# Patient Record
Sex: Male | Born: 1980 | Race: White | Hispanic: No | Marital: Married | Smoking: Never smoker
Health system: Southern US, Community
[De-identification: ages and names within clinical notes are randomized; demographics above are authoritative.]

## PROBLEM LIST (undated history)

## (undated) DIAGNOSIS — E7800 Pure hypercholesterolemia, unspecified: Secondary | ICD-10-CM

---

## 2004-12-06 ENCOUNTER — Emergency Department: Payer: Self-pay | Admitting: Emergency Medicine

## 2005-04-06 ENCOUNTER — Ambulatory Visit: Payer: Self-pay | Admitting: Internal Medicine

## 2007-03-17 ENCOUNTER — Emergency Department: Payer: Self-pay | Admitting: Emergency Medicine

## 2007-12-20 ENCOUNTER — Emergency Department: Payer: Self-pay | Admitting: Emergency Medicine

## 2008-10-09 ENCOUNTER — Emergency Department: Payer: Self-pay | Admitting: Emergency Medicine

## 2010-01-29 ENCOUNTER — Emergency Department: Payer: Self-pay | Admitting: Unknown Physician Specialty

## 2010-02-16 ENCOUNTER — Emergency Department: Payer: Self-pay | Admitting: Emergency Medicine

## 2010-11-26 IMAGING — CR DG CHEST 2V
1 series · 2 of 2 positions shown · non-contrast
Comparison: none

REASON FOR EXAM: vomited
COMMENTS:

[Series 1: view not recorded · 0.17mm/px · 2 of 2 slices shown]
[im 1/2]
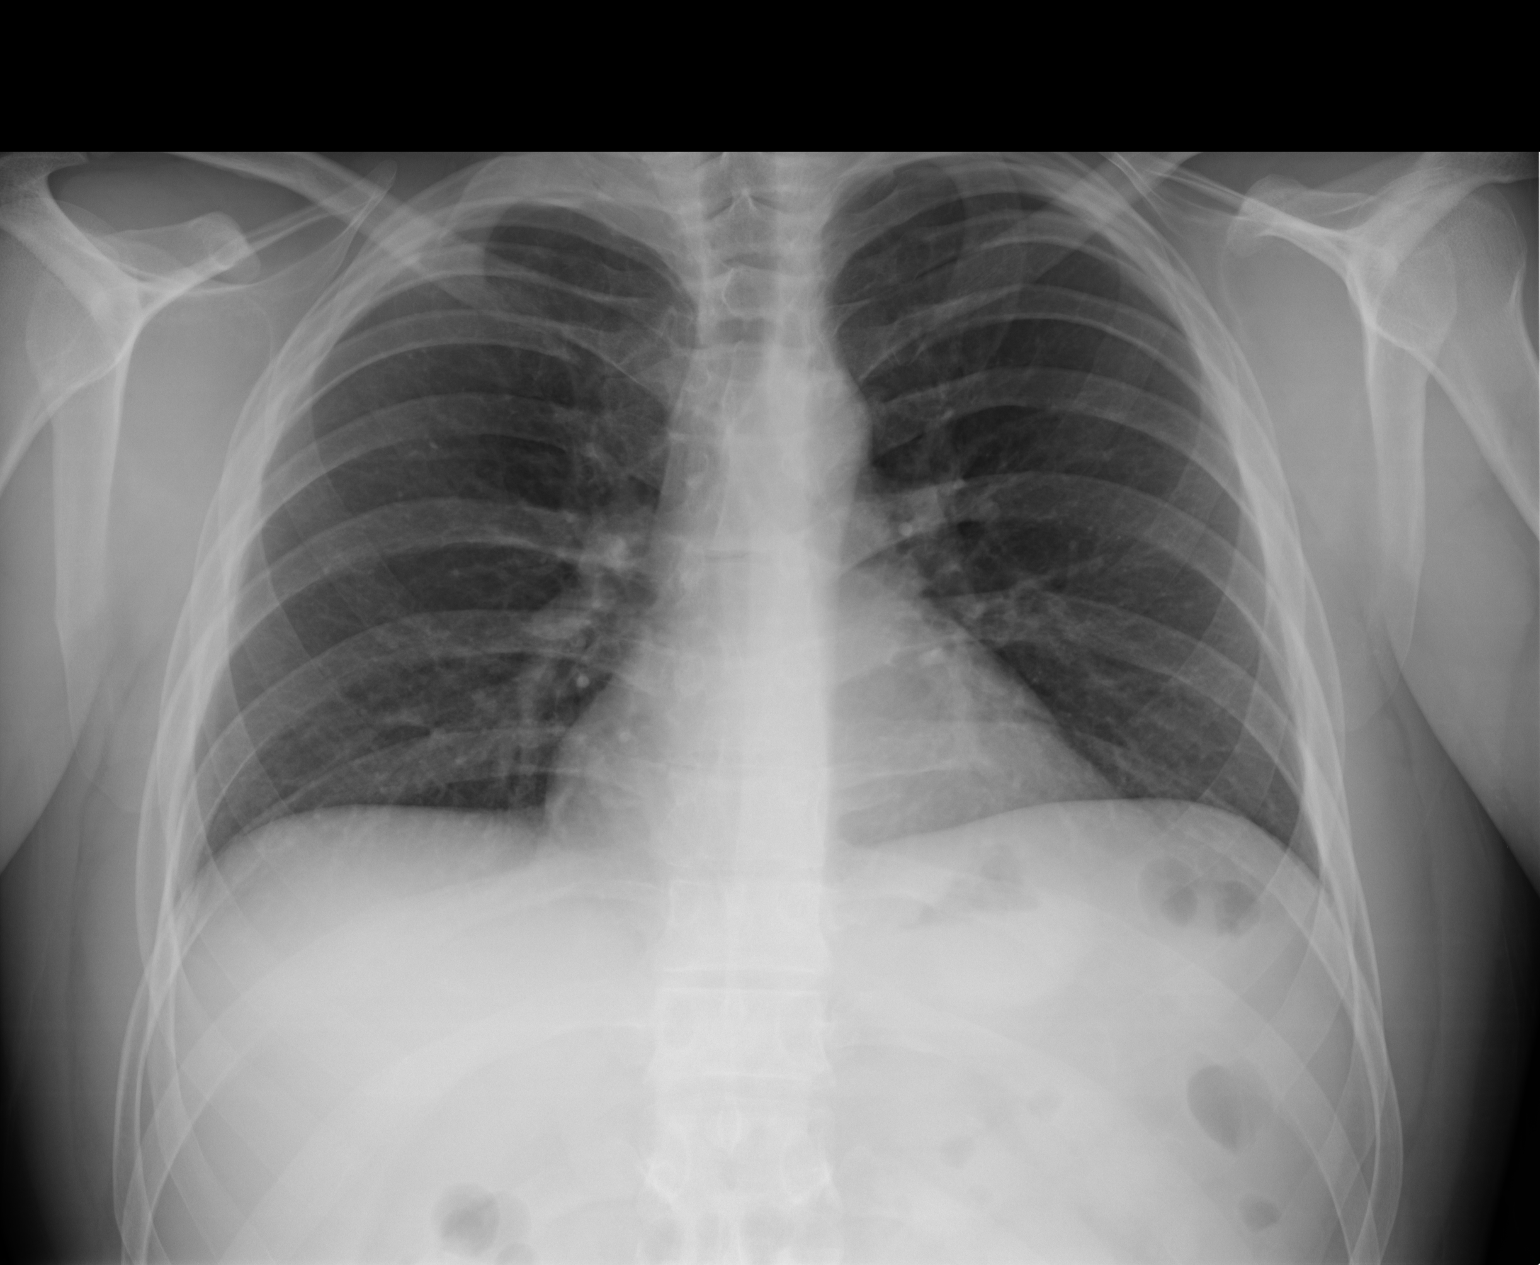
[im 2/2]
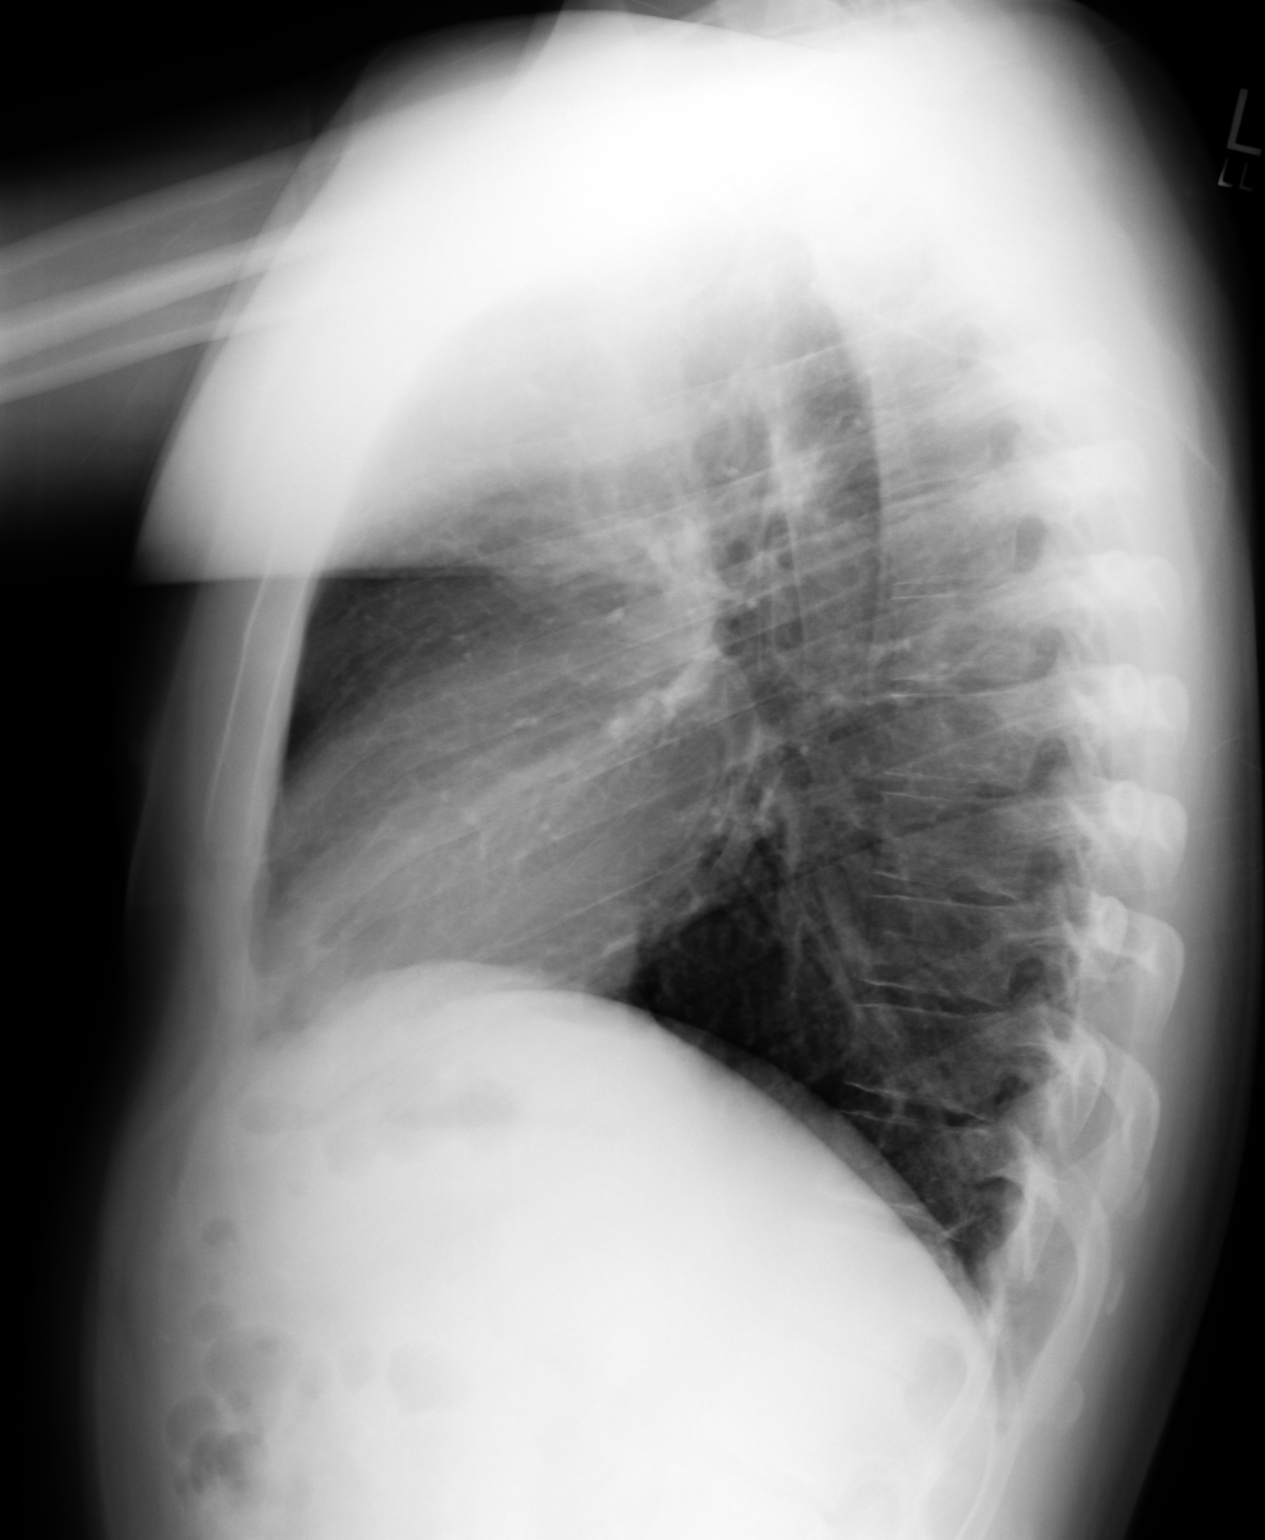

[2 of 2 positions shown; findings below may reference images not displayed]

PROCEDURE:     DXR - DXR CHEST PA (OR AP) AND LATERAL  - February 16, 2010  [DATE]

RESULT:     The lungs are mildly hypoinflated. I see no focal infiltrate.
There is no evidence of a pleural effusion. The cardiac silhouette is normal
in size. The pulmonary vascularity is not engorged. The gas pattern in the
upper abdomen appears normal.
IMPRESSION: There is bilateral pulmonary hypoinflation. Otherwise the
examination is within the limits of normal.

## 2011-08-10 ENCOUNTER — Emergency Department: Payer: Self-pay | Admitting: Emergency Medicine

## 2021-03-31 ENCOUNTER — Ambulatory Visit
Admission: EM | Admit: 2021-03-31 | Discharge: 2021-03-31 | Disposition: A | Discharge status: Home / Self Care | Payer: BC Managed Care – PPO | Attending: Family Medicine | Admitting: Family Medicine

## 2021-03-31 DIAGNOSIS — M79661 Pain in right lower leg: Secondary | ICD-10-CM

## 2021-03-31 DIAGNOSIS — S80861A Insect bite (nonvenomous), right lower leg, initial encounter: Secondary | ICD-10-CM | POA: Diagnosis not present

## 2021-03-31 DIAGNOSIS — M7989 Other specified soft tissue disorders: Secondary | ICD-10-CM | POA: Diagnosis not present

## 2021-03-31 DIAGNOSIS — W57XXXA Bitten or stung by nonvenomous insect and other nonvenomous arthropods, initial encounter: Secondary | ICD-10-CM | POA: Diagnosis not present

## 2021-03-31 MED ORDER — LEVOCETIRIZINE DIHYDROCHLORIDE 5 MG PO TABS: 5.0000 mg | ORAL_TABLET | Freq: Every evening | ORAL | 0 refills | Status: AC

## 2021-03-31 MED ORDER — PREDNISONE 20 MG PO TABS: 20.0000 mg | ORAL_TABLET | Freq: Every day | ORAL | 0 refills | Status: DC

## 2021-03-31 MED ORDER — PREDNISONE 20 MG PO TABS: 20.0000 mg | ORAL_TABLET | Freq: Every day | ORAL | 0 refills | Status: AC

## 2021-03-31 MED ORDER — DEXAMETHASONE 1 MG/ML PO CONC
10.0000 mg | Freq: Once | ORAL | Status: AC
  Administered 2021-03-31: 10 mg via ORAL

## 2021-03-31 MED ORDER — TRIAMCINOLONE ACETONIDE 0.025 % EX OINT: 1.0000 "application " | TOPICAL_OINTMENT | Freq: Two times a day (BID) | CUTANEOUS | 0 refills | Status: AC

## 2021-03-31 NOTE — ED Provider Notes (Signed)
[facility]    CSN: [id number] Arrival date & time: 03/31/21  0836      History   Chief Complaint Chief Complaint  Patient presents with   Insect Bite    HPI Brian Harrell is a 40 y.o. male.   HPI Patient presents today with swelling and redness along with itching and irritation involving the right lower leg.  Patient reports he was stung by multiple yellow jackets yesterday evening and upon awakening this morning the swelling and redness has worsened.  He denies any difficulty swallowing or any swelling of the lips.  Denies any previous reactions to bee or hornet stings.  No past medical history on file.  There are no problems to display for this patient.     Home Medications    Prior to Admission medications   Medication Sig Start Date End Date Taking? Authorizing Provider  levocetirizine (XYZAL) 5 MG tablet Take 1 tablet (5 mg total) by mouth every evening. 03/31/21  Yes Bing Neighbors, FNP  triamcinolone (KENALOG) 0.025 % ointment Apply 1 application topically 2 (two) times daily. 03/31/21  Yes Bing Neighbors, FNP  predniSONE (DELTASONE) 20 MG tablet Take 1 tablet (20 mg total) by mouth daily with breakfast for 5 days. 04/01/21 04/06/21  Bing Neighbors, FNP    Family History No family history on file.  Social History     Allergies   Patient has no known allergies.   Review of Systems Review of Systems Pertinent negatives listed in HPI   Physical Exam Triage Vital Signs ED Triage Vitals  Enc Vitals Group     BP 03/31/21 0839 132/87     Pulse Rate 03/31/21 0839 73     Resp 03/31/21 0839 20     Temp 03/31/21 0839 98 F (36.7 C)     Temp src --      SpO2 03/31/21 0839 98 %     Weight --      Height --      Head Circumference --      Peak Flow --      Pain Score 03/31/21 0837 4     Pain Loc --      Pain Edu? --      Excl. in GC? --    No data found.  Updated Vital Signs BP 132/87   Pulse 73   Temp 98 F (36.7 C)   Resp  20   SpO2 98%   Visual Acuity Right Eye Distance:   Left Eye Distance:   Bilateral Distance:    Right Eye Near:   Left Eye Near:    Bilateral Near:     Physical Exam General appearance: Alert, well developed, well nourished, cooperative and in no distress Head: Normocephalic, without obvious abnormality, atraumatic Respiratory: Respirations even and unlabored, normal respiratory rate Heart: Rate and rhythm normal.  Extremities: Right lower leg  diffuse swelling , visible papules consistent with that of insect bites, erythema Skin: Skin color, texture, turgor normal. No rashes seen  Psych: Appropriate mood and affect. Neurologic: GCS 15, normal coordination normal gait  UC Treatments / Results  Labs (all labs ordered are listed, but only abnormal results are displayed) Labs Reviewed - No data to display  EKG   Radiology No results found.  Procedures Procedures (including critical care time)  Medications Ordered in UC Medications  dexamethasone (DECADRON) 1 MG/ML solution 10 mg (has no administration in time range)    Initial Impression / Assessment  and Plan / UC Course  I have reviewed the triage vital signs and the nursing notes.  Pertinent labs & imaging results that were available during my care of the patient were reviewed by me and considered in my medical decision making (see chart for details).    Local reaction related to insect bite treating today with oral Decadron patient will take a 5-day course of prednisone 20 mg once daily starting tomorrow.  Also prescribed triamcinolone cream 3 times daily as needed for management of pruritus.  Work note provided.  Return precautions given if symptoms worsen or do not improve. Final Clinical Impressions(s) / UC Diagnoses   Final diagnoses:  Insect bite of right lower leg with local reaction, initial encounter  Pain and swelling of right lower leg     Discharge Instructions      Start prednisone tomorrow.    Start levocetirizine 5 mg tonight prior to bedtime and take daily for the next 7 days. Apply triamcinolone cream 3 times daily as needed to lower leg until itching and swelling resolves.     ED Prescriptions     Medication Sig Dispense Auth. Provider   levocetirizine (XYZAL) 5 MG tablet Take 1 tablet (5 mg total) by mouth every evening. 10 tablet Bing Neighbors, FNP   triamcinolone (KENALOG) 0.025 % ointment Apply 1 application topically 2 (two) times daily. 80 g Bing Neighbors, FNP   predniSONE (DELTASONE) 20 MG tablet  (Status: Discontinued) Take 1 tablet (20 mg total) by mouth daily with breakfast for 5 days. 5 tablet Bing Neighbors, FNP   predniSONE (DELTASONE) 20 MG tablet Take 1 tablet (20 mg total) by mouth daily with breakfast for 5 days. 5 tablet Bing Neighbors, FNP      PDMP not reviewed this encounter.   Bing Neighbors, [place] 03/31/21 [phone]

## 2021-03-31 NOTE — ED Triage Notes (Signed)
Pt presents with c/o bee sting to right lower leg that happened last night, foot is swollen and painful

## 2021-03-31 NOTE — Discharge Instructions (Addendum)
Start prednisone tomorrow.   Start levocetirizine 5 mg tonight prior to bedtime and take daily for the next 7 days. Apply triamcinolone cream 3 times daily as needed to lower leg until itching and swelling resolves.

## 2022-07-09 ENCOUNTER — Ambulatory Visit
Admission: EM | Admit: 2022-07-09 | Discharge: 2022-07-09 | Disposition: A | Discharge status: Home / Self Care | Payer: BC Managed Care – PPO

## 2022-07-09 ENCOUNTER — Other Ambulatory Visit: Payer: Self-pay

## 2022-07-09 ENCOUNTER — Encounter: Payer: Self-pay | Admitting: Emergency Medicine

## 2022-07-09 DIAGNOSIS — S0990XA Unspecified injury of head, initial encounter: Secondary | ICD-10-CM | POA: Diagnosis not present

## 2022-07-09 NOTE — ED Triage Notes (Addendum)
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 Pt reports has discussed event with employer and they are aware of incident from yesterday and pt reports is not workman's comp. Reports just need to be evaluated before can return to work.   Pt denies loc or being on blood thinners.

## 2022-07-09 NOTE — Discharge Instructions (Addendum)
Get plenty of rest. May take Tylenol as needed for pain.  May apply ice to the head to help with swelling. Apply for 20 minutes, remove for 1 hour, then repeat. Go to the emergency department immediately if you develop worsening headache, change in mental status, visual changes, dizziness, difficulty speaking or inability to speak, or difficult to arouse. Follow-up as needed.

## 2022-07-09 NOTE — ED Provider Notes (Signed)
[facility]    CSN: [id number] Arrival date & time: 07/09/22  0817      History   Chief Complaint Chief Complaint  Patient presents with   Head Injury    HPI Brian Harrell is a 41 y.o. male.   The history is provided by the patient.   The patient presents after he hit his head on a piece of metal at work.  Patient states that he was bending down and hit the top of his head on the metal, despite knowing that the metal was there.  Patient denies loss of consciousness, change in mental status, visual changes, headache, drowsiness, fatigue.  Patient states he does have a scratch on the top of his head where he hit the metal.  He denies any open wounds.  Patient states He has not had any previous head injuries to include concussions.  Also denies any prior history of seizures.  Patient reports he does not take any medication at this time.  History reviewed. No pertinent past medical history.  There are no problems to display for this patient.   History reviewed. No pertinent surgical history.     Home Medications    Prior to Admission medications   Medication Sig Start Date End Date Taking? Authorizing Provider  levocetirizine (XYZAL) 5 MG tablet Take 1 tablet (5 mg total) by mouth every evening. 03/31/21   Scot Jun, FNP  triamcinolone (KENALOG) 0.025 % ointment Apply 1 application topically 2 (two) times daily. 03/31/21   Scot Jun, FNP    Family History History reviewed. No pertinent family history.  Social History Social History   Tobacco Use   Smoking status: Never   Smokeless tobacco: Never  Vaping Use   Vaping Use: Some days  Substance Use Topics   Alcohol use: Yes    Comment: occ     Allergies   Patient has no known allergies.   Review of Systems Review of Systems Per HPI  Physical Exam Triage Vital Signs ED Triage Vitals [07/09/22 0831]  Enc Vitals Group     BP (!) 131/90     Pulse Rate 68     Resp 18     Temp  97.8 F (36.6 C)     Temp Source Oral     SpO2 97 %     Weight      Height      Head Circumference      Peak Flow      Pain Score 1     Pain Loc      Pain Edu?      Excl. in [place]?    No data found.  Updated Vital Signs BP (!) 131/90 (BP Location: Right Arm)   Pulse 68   Temp 97.8 F (36.6 C) (Oral)   Resp 18   SpO2 97%   Visual Acuity Right Eye Distance:   Left Eye Distance:   Bilateral Distance:    Right Eye Near:   Left Eye Near:    Bilateral Near:     Physical Exam Vitals and nursing note reviewed.  Constitutional:      General: He is not in acute distress.    Appearance: Normal appearance.  HENT:     Head: Normocephalic.     Right Ear: Tympanic membrane, ear canal and external ear normal.     Left Ear: Tympanic membrane, ear canal and external ear normal.     Nose: Nose normal.  Mouth/Throat:     Mouth: Mucous membranes are moist.  Eyes:     Extraocular Movements: Extraocular movements intact.     Conjunctiva/sclera: Conjunctivae normal.     Pupils: Pupils are equal, round, and reactive to light.  Cardiovascular:     Rate and Rhythm: Normal rate and regular rhythm.     Pulses: Normal pulses.     Heart sounds: Normal heart sounds.  Pulmonary:     Effort: Pulmonary effort is normal.     Breath sounds: Normal breath sounds.  Abdominal:     General: Bowel sounds are normal.     Palpations: Abdomen is soft.  Musculoskeletal:     Cervical back: Normal range of motion.  Lymphadenopathy:     Cervical: No cervical adenopathy.  Skin:    General: Skin is warm and dry.  Neurological:     General: No focal deficit present.     Mental Status: He is alert and oriented to person, place, and time.     GCS: GCS eye subscore is 4. GCS verbal subscore is 5. GCS motor subscore is 6.     Cranial Nerves: Cranial nerves 2-12 are intact. No cranial nerve deficit or facial asymmetry.     Sensory: Sensation is intact.     Motor: Motor function is intact. No weakness or  tremor.     Coordination: Coordination is intact. Romberg sign negative. Coordination normal. Finger-Nose-Finger Test and Heel to [facility] Test normal.  Psychiatric:        Mood and Affect: Mood normal.        Behavior: Behavior normal.      UC Treatments / Results  Labs (all labs ordered are listed, but only abnormal results are displayed) Labs Reviewed - No data to display  EKG   Radiology No results found.  Procedures Procedures (including critical care time)  Medications Ordered in UC Medications - No data to display  Initial Impression / Assessment and Plan / UC Course  I have reviewed the triage vital signs and the nursing notes.  Pertinent labs & imaging results that were available during my care of the patient were reviewed by me and considered in my medical decision making (see chart for details).  The patient is well-appearing, he is in no acute distress, vital signs are stable at this time.  Patient presents after a head injury that occurred 1 day ago.  His neurological exam is within normal limits, no neurodeficits noted.  Physical exam and vital signs are stable for discharge.  Symptoms are consistent with a head injury without loss of consciousness, no signs of concussion are present at this time..  Patient was given supportive care recommendations to include use of over-the-counter medications such as Tylenol, and to apply ice to his head to help with any pain or swelling.  Patient was also given strict indications of when to go to the emergency department.  Patient verbalizes understanding.  All questions were answered.  Patient stable for discharge. Final Clinical Impressions(s) / UC Diagnoses   Final diagnoses:  Acute head injury without loss of consciousness, initial encounter     Discharge Instructions      Get plenty of rest. May take Tylenol as needed for pain.  May apply ice to the head to help with swelling. Apply for 20 minutes, remove for 1 hour, then  repeat. Go to the emergency department immediately if you develop worsening headache, change in mental status, visual changes, dizziness, difficulty speaking or inability to  speak, or difficult to arouse. Follow-up as needed.      ED Prescriptions   None    PDMP not reviewed this encounter.   Abran Cantor, NP 07/09/22 [phone]

## 2022-11-14 ENCOUNTER — Encounter: Payer: Self-pay | Admitting: Emergency Medicine

## 2022-11-14 ENCOUNTER — Ambulatory Visit
Admission: EM | Admit: 2022-11-14 | Discharge: 2022-11-14 | Disposition: A | Discharge status: Home / Self Care | Payer: BC Managed Care – PPO

## 2022-11-14 HISTORY — DX: Pure hypercholesterolemia, unspecified: E78.00
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
## 2022-11-14 NOTE — ED Notes (Signed)
Patient wanting a CT scan and states workers comp is following case, patient opted to leave facility

## 2023-07-13 ENCOUNTER — Other Ambulatory Visit: Payer: Self-pay | Admitting: Nurse Practitioner

## 2023-07-13 DIAGNOSIS — R112 Nausea with vomiting, unspecified: Secondary | ICD-10-CM

## 2023-08-01 ENCOUNTER — Ambulatory Visit: Payer: BC Managed Care – PPO

## 2023-08-01 DIAGNOSIS — D120 Benign neoplasm of cecum: Secondary | ICD-10-CM

## 2023-08-01 DIAGNOSIS — R634 Abnormal weight loss: Secondary | ICD-10-CM | POA: Diagnosis not present

## 2023-08-01 DIAGNOSIS — R12 Heartburn: Secondary | ICD-10-CM

## 2023-08-01 DIAGNOSIS — R1013 Epigastric pain: Secondary | ICD-10-CM

## 2023-08-01 DIAGNOSIS — R197 Diarrhea, unspecified: Secondary | ICD-10-CM

## 2023-08-12 ENCOUNTER — Ambulatory Visit: Admission: RE | Admit: 2023-08-12 | Payer: BC Managed Care – PPO | Source: Ambulatory Visit

## 2023-08-15 ENCOUNTER — Ambulatory Visit: Admission: RE | Admit: 2023-08-15 | Payer: BC Managed Care – PPO | Source: Ambulatory Visit
